# Patient Record
Sex: Female | Born: 2000 | Race: White | Hispanic: No | Marital: Single | Smoking: Never smoker
Health system: Southern US, Community
[De-identification: ages and names within clinical notes are randomized; demographics above are authoritative.]

## PROBLEM LIST (undated history)

## (undated) DIAGNOSIS — Q249 Congenital malformation of heart, unspecified: Secondary | ICD-10-CM

## (undated) HISTORY — DX: Congenital malformation of heart, unspecified: Q24.9

## (undated) HISTORY — PX: WISDOM TOOTH EXTRACTION: SHX21

## (undated) HISTORY — PX: CARDIAC VALVE SURGERY: SHX40

---
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
## 2020-02-13 MED FILL — CARTIA XT 120 MG CP24: 120 | 30 days supply | Qty: 30 | Fill #0

## 2020-03-22 MED FILL — JULEBER 0.15-30 MG-MCG TABS: 0.15-30 | 84 days supply | Qty: 84 | Fill #3

## 2020-03-22 MED FILL — CARTIA XT 120 MG CP24: 120 | 30 days supply | Qty: 30 | Fill #1

## 2020-04-15 MED FILL — CARTIA XT 120 MG CP24: 120 | 30 days supply | Qty: 30 | Fill #0

## 2020-05-31 MED FILL — CARTIA XT 180 MG CAPSULE SA: 180 | 90 days supply | Qty: 90 | Fill #0

## 2020-08-09 MED FILL — JULEBER 0.15-30 MG-MCG TABS: 0.15-30 | 28 days supply | Qty: 28 | Fill #0

## 2020-08-28 ENCOUNTER — Other Ambulatory Visit: Payer: BLUE CROSS/BLUE SHIELD

## 2020-08-28 DIAGNOSIS — Z20822 Contact with and (suspected) exposure to covid-19: Secondary | ICD-10-CM

## 2020-08-29 LAB — SARS-COV-2, NAA 2 DAY TAT

## 2020-08-29 LAB — NOVEL CORONAVIRUS, NAA: SARS-CoV-2, NAA: NOT DETECTED

## 2020-12-30 ENCOUNTER — Other Ambulatory Visit (HOSPITAL_COMMUNITY): Payer: Self-pay

## 2020-12-30 MED ORDER — DILTIAZEM HCL ER COATED BEADS 180 MG PO CP24
180.0000 mg | ORAL_CAPSULE | Freq: Every day | ORAL | 1 refills | Status: DC
  Filled 2020-12-30: qty 90, 90d supply, fill #0
  Filled 2021-03-26: qty 90, 90d supply, fill #1

## 2020-12-30 MED ORDER — DESOGESTREL-ETHINYL ESTRADIOL 0.15-30 MG-MCG PO TABS
ORAL_TABLET | Freq: Every day | ORAL | 2 refills | Status: DC
  Filled 2020-12-30: qty 84, 84d supply, fill #0
  Filled 2021-03-25: qty 28, 28d supply, fill #1
  Filled 2021-03-25: qty 84, 84d supply, fill #1
  Filled 2021-03-26: qty 84, 84d supply, fill #2
  Filled 2021-04-07: qty 28, 28d supply, fill #3
  Filled 2021-07-30: qty 28, 28d supply, fill #4

## 2020-12-31 ENCOUNTER — Other Ambulatory Visit (HOSPITAL_COMMUNITY): Payer: Self-pay

## 2021-02-28 ENCOUNTER — Other Ambulatory Visit: Payer: Self-pay | Admitting: Internal Medicine

## 2021-02-28 DIAGNOSIS — R221 Localized swelling, mass and lump, neck: Secondary | ICD-10-CM

## 2021-03-07 ENCOUNTER — Other Ambulatory Visit (HOSPITAL_COMMUNITY): Payer: Self-pay

## 2021-03-25 ENCOUNTER — Other Ambulatory Visit (HOSPITAL_COMMUNITY): Payer: Self-pay

## 2021-03-25 ENCOUNTER — Ambulatory Visit
Admission: RE | Admit: 2021-03-25 | Discharge: 2021-03-25 | Disposition: A | Discharge status: Home / Self Care | Payer: Self-pay | Source: Ambulatory Visit | Attending: Internal Medicine | Admitting: Internal Medicine

## 2021-03-25 DIAGNOSIS — R221 Localized swelling, mass and lump, neck: Secondary | ICD-10-CM

## 2021-03-26 ENCOUNTER — Other Ambulatory Visit (HOSPITAL_COMMUNITY): Payer: Self-pay

## 2021-03-28 ENCOUNTER — Other Ambulatory Visit (HOSPITAL_COMMUNITY): Payer: Self-pay

## 2021-04-07 ENCOUNTER — Other Ambulatory Visit (HOSPITAL_COMMUNITY): Payer: Self-pay

## 2021-04-07 MED ORDER — DILTIAZEM HCL ER COATED BEADS 180 MG PO CP24
180.0000 mg | ORAL_CAPSULE | Freq: Every day | ORAL | 1 refills | Status: DC
  Filled 2021-04-07 – 2021-04-08 (×2): qty 90, 90d supply, fill #0
  Filled 2022-01-08: qty 90, 90d supply, fill #1
  Filled 2023-01-15: qty 5, 5d supply, fill #2

## 2021-04-08 ENCOUNTER — Other Ambulatory Visit (HOSPITAL_COMMUNITY): Payer: Self-pay

## 2021-04-09 ENCOUNTER — Other Ambulatory Visit (HOSPITAL_COMMUNITY): Payer: Self-pay

## 2021-07-30 ENCOUNTER — Other Ambulatory Visit (HOSPITAL_COMMUNITY): Payer: Self-pay

## 2021-08-29 ENCOUNTER — Other Ambulatory Visit (HOSPITAL_COMMUNITY): Payer: Self-pay

## 2021-08-29 MED ORDER — DESOGESTREL-ETHINYL ESTRADIOL 0.15-30 MG-MCG PO TABS
1.0000 | ORAL_TABLET | Freq: Every day | ORAL | 3 refills | Status: DC
  Filled 2021-08-29: qty 84, 84d supply, fill #0
  Filled 2021-11-24: qty 84, 84d supply, fill #1
  Filled 2022-02-23: qty 84, 84d supply, fill #2

## 2021-10-06 ENCOUNTER — Other Ambulatory Visit (HOSPITAL_COMMUNITY): Payer: Self-pay

## 2021-11-24 ENCOUNTER — Other Ambulatory Visit (HOSPITAL_COMMUNITY): Payer: Self-pay

## 2022-01-08 ENCOUNTER — Other Ambulatory Visit (HOSPITAL_COMMUNITY): Payer: Self-pay

## 2022-02-23 ENCOUNTER — Other Ambulatory Visit (HOSPITAL_COMMUNITY): Payer: Self-pay

## 2022-04-11 ENCOUNTER — Other Ambulatory Visit (HOSPITAL_COMMUNITY): Payer: Self-pay

## 2022-05-13 ENCOUNTER — Other Ambulatory Visit (HOSPITAL_COMMUNITY): Payer: Self-pay

## 2022-05-19 ENCOUNTER — Other Ambulatory Visit: Payer: Self-pay | Admitting: Internal Medicine

## 2022-05-19 DIAGNOSIS — N926 Irregular menstruation, unspecified: Secondary | ICD-10-CM

## 2022-05-19 DIAGNOSIS — R1032 Left lower quadrant pain: Secondary | ICD-10-CM

## 2022-05-25 ENCOUNTER — Other Ambulatory Visit: Payer: Self-pay | Admitting: Internal Medicine

## 2022-05-25 ENCOUNTER — Ambulatory Visit
Admission: RE | Admit: 2022-05-25 | Discharge: 2022-05-25 | Disposition: A | Discharge status: Home / Self Care | Payer: Managed Care, Other (non HMO) | Source: Ambulatory Visit | Attending: Internal Medicine

## 2022-05-25 DIAGNOSIS — N926 Irregular menstruation, unspecified: Secondary | ICD-10-CM

## 2022-05-25 DIAGNOSIS — R1032 Left lower quadrant pain: Secondary | ICD-10-CM

## 2022-07-30 ENCOUNTER — Encounter: Payer: Self-pay | Admitting: Gastroenterology

## 2022-09-03 ENCOUNTER — Ambulatory Visit (INDEPENDENT_AMBULATORY_CARE_PROVIDER_SITE_OTHER): Payer: Managed Care, Other (non HMO) | Admitting: Gastroenterology

## 2022-09-03 ENCOUNTER — Encounter: Payer: Self-pay | Admitting: Gastroenterology

## 2022-09-03 VITALS — BP 126/82 | HR 110 | Ht 62.0 in | Wt 179.0 lb

## 2022-09-03 DIAGNOSIS — E611 Iron deficiency: Secondary | ICD-10-CM | POA: Diagnosis not present

## 2022-09-03 DIAGNOSIS — K625 Hemorrhage of anus and rectum: Secondary | ICD-10-CM

## 2022-09-03 DIAGNOSIS — K581 Irritable bowel syndrome with constipation: Secondary | ICD-10-CM

## 2022-09-03 NOTE — Patient Instructions (Addendum)
_______________________________________________________  If you are age 22 or older, your body mass index should be between 23-30. Your Body mass index is 32.74 kg/m. If this is out of the aforementioned range listed, please consider follow up with your Primary Care Provider.  If you are age 59 or younger, your body mass index should be between 19-25. Your Body mass index is 32.74 kg/m. If this is out of the aformentioned range listed, please consider follow up with your Primary Care Provider.   Your provider has requested that you go to the basement level for lab work before leaving today. Press "B" on the elevator. The lab is located at the first door on the left as you exit the elevator.   Increase fiber gummies to twice daily.  Senna two tablets as needed when you have not had a bowel movement in two days.  The [facility] providers would like to encourage you to use [facility] to communicate with providers for non-urgent requests or questions.  Due to long hold times on the telephone, sending your provider a message by [facility] may be a faster and more efficient way to get a response.  Please allow 48 business hours for a response.  Please remember that this is for non-urgent requests.   It was a pleasure to see you today!  Thank you for trusting me with your gastrointestinal care!    Scott E.Candis Schatz, MD

## 2022-09-03 NOTE — Progress Notes (Signed)
HPI : Loretta Lowe is a very pleasant 22 year old female with a history of congenital heart disease referred to [place] by Dr. Cristie Hem for further evaluation of chronic abdominal pain and constipation.  She says these problems started over a year ago.  Her constipation manifests as infrequent bowel movements, typically only going every 3 days or so, but sometimes going as long as 5 days.  Her stools are typically soft and formed, but sometimes they can be small, hard, and pellet-like.  She denies frequent problems with straining or difficulty evacuating stool.  She has seen blood intermittently over the past year.  Blood seen only on the toilet paper and is noticed more when she has hard stools.  She denies problems with pain with the passage of stool.  No symptoms of perianal swelling/lumps, itching or burning.  She has chronic abdominal pain which is localized to the left lower quadrant.  She describes it as a dull achy pain, similar to a pulled muscle.  Sometimes it is throbbing in character.  Her pain does not seem to vary much with her diet or with her bowel habits.  It does not necessarily get worse if she is gone long periods without a bowel movement.  It is not improved with bowel movements usually.  The pain is not severe, typically 1-2/10 in severity.  It is not worsened with any particular physical activities or positions.   She reports frequently feeling bloated and gassy.  No problems with nausea or vomiting.  No problems with diarrhea.  She has tried taking magnesium tablets which did not make any difference in her symptoms.  She started taking gummy fibers recently, and this does seem to help a little bit.  She is also taking MiraLAX in the past, but not consistently.  She did not think it helped very much either when she took it.  She states that she was recently told that she had low iron levels.  She was not anemic.  She was advised not to take oral iron supplements because it would worsen  her constipation.  She is to be vegetarian, but she is starting to add meat back into her diet to increase her dietary iron intake. She drinks plenty of water, typically 40 ounces per day.  She has no family history of celiac disease, inflammatory bowel disease or malignancy of the GI tract.  Her mother does have constipation, and likely IBS.  Patient has a history of congenital heart disease (double aortic arch).  She underwent surgery date 6 months.  She had no problems until she was found to have an arrhythmia (frequent PVCs) about 3 years ago.  She has been followed by cardiology and takes diltiazem.   Past Medical History:  Diagnosis Date   Cardiac arrhythmia due to congenital heart disease    Past Surgical History:  Procedure Laterality Date   CARDIAC VALVE SURGERY     Family History  Problem Relation Age of Onset   Hypertension Father    Breast cancer Maternal Grandmother    Diabetes Maternal Grandfather    Lung cancer Maternal Grandfather    COPD Paternal Grandfather    Colon cancer Neg Hx    Stomach cancer Neg Hx    Esophageal cancer Neg Hx    Social History   Tobacco Use   Smoking status: Never   Smokeless tobacco: Never  Substance Use Topics   Alcohol use: No   Drug use: No   Current Outpatient Medications  Medication Sig Dispense Refill   desogestrel-ethinyl estradiol (JULEBER) 0.15-30 MG-MCG tablet Take 1 tablet by mouth daily. 84 tablet 3   diltiazem (CARDIZEM CD) 180 MG 24 hr capsule Take 1 capsule (180 mg total) by mouth daily. 90 capsule 1   NON FORMULARY Fiber gummie     ACANYA gel      minocycline (MINOCIN,DYNACIN) 100 MG capsule      TAZORAC 0.05 % cream      No current facility-administered medications for this visit.   No Known Allergies   Review of Systems: All systems reviewed and negative except where noted in HPI.    No results found.  Physical Exam: BP 126/82   Pulse (!) 110   Ht 5\' 2"  (1.575 m)   Wt 179 lb (81.2 kg)   BMI 32.74  kg/m  Constitutional: Pleasant,well-developed, Caucasian female in no acute distress. HEENT: Normocephalic and atraumatic. Conjunctivae are normal. No scleral icterus. Neck supple.  Cardiovascular: Normal rate, regular rhythm.  Frequent ectopy noted Pulmonary/chest: Effort normal and breath sounds normal. No wheezing, rales or rhonchi. Abdominal: Soft, nondistended, nontender.  Left lower quadrant pain not reproducible on exam.  Bowel sounds active throughout. There are no masses palpable. No hepatomegaly. Extremities: no edema Neurological: Alert and oriented to person place and time. Skin: Skin is warm and dry. No rashes noted. Psychiatric: Normal mood and affect. Behavior is normal.  CBC No results found for: "WBC", "RBC", "HGB", "HCT", "PLT", "MCV", "MCH", "MCHC", "RDW", "LYMPHSABS", "MONOABS", "EOSABS", "BASOSABS"  CMP  No results found for: "NA", "K", "CL", "CO2", "GLUCOSE", "BUN", "CREATININE", "CALCIUM", "PROT", "ALBUMIN", "AST", "ALT", "ALKPHOS", "BILITOT", "GFRNONAA", "GFRAA"   ASSESSMENT AND PLAN: 22 year old female with 1 year history of bothersome constipation and abdominal discomfort.  Although her symptoms do not fit exactly with IBS (no association between bowel movements and pain), I think this is the most likely diagnosis.  We discussed the proposed pathophysiology of IBS and gut brain axis disorders in general.  We discussed management of IBS, to include use of medications to improve bowel habits, as needed pain medicine, centrally acting neuromodulators, role of empiric dietary modifications to include a low FODMAP diet gluten-free diet, as well as the role of cognitive therapies.  We discussed the goals of IBS management, namely to minimize the impact of GI symptoms on quality of life. For now, I recommend she just continue with fiber supplementation and to take 2 tabs of senna at night as needed for when she has gone 2 or 3 days without a bowel movement. Her infrequent  painless hematochezia is almost certainly from hemorrhoidal bleeding when she has hard stools.  She does not have symptoms suggestive of proctitis for inflammatory bowel disease.  A mass lesion is extremely unlikely given her young age, lack of family history and association of blood with hard stools.  A colonoscopy was offered to definitively exclude a mass lesion especially with iron deficiency, but with the caveat that it would be very low yield.  She preferred to defer colonoscopy for now given low likelihood of any abnormalities.  Reconsider colonoscopy if bleeding becomes more frequent or profuse.   IBS-C - Fiber gummies - Senna PRN  Painless hematochezia - Almost certainly hemorrhoidal - Colonoscopy discussed, but deferred for now  Iron deficiency without anemia - Likely secondary to menstrual blood loss/low dietary intake  Macenzie Burford E. Candis Schatz, MD [facility]  CC:  Michael Boston, MD

## 2022-09-04 ENCOUNTER — Other Ambulatory Visit: Payer: Managed Care, Other (non HMO)

## 2022-09-04 DIAGNOSIS — K581 Irritable bowel syndrome with constipation: Secondary | ICD-10-CM

## 2022-09-05 LAB — IGA: Immunoglobulin A: 194 mg/dL (ref 47–310)

## 2022-09-05 LAB — TISSUE TRANSGLUTAMINASE, IGA: (tTG) Ab, IgA: <1 U/mL

## 2022-09-06 NOTE — Progress Notes (Signed)
Loretta Lowe,  Your test for celiac disease was unremarkable.  Please continue the fiber and senna as recommended and follow up as needed for ongoing assistance with your chronic constipation and abdominal pain.

## 2022-09-09 ENCOUNTER — Encounter: Payer: Self-pay | Admitting: Gastroenterology

## 2022-09-18 ENCOUNTER — Other Ambulatory Visit (HOSPITAL_COMMUNITY): Payer: Self-pay

## 2022-09-18 ENCOUNTER — Other Ambulatory Visit: Payer: Self-pay

## 2022-09-18 MED ORDER — DICYCLOMINE HCL 20 MG PO TABS
20.0000 mg | ORAL_TABLET | Freq: Four times a day (QID) | ORAL | 1 refills | Status: AC
  Filled 2022-09-18: qty 30, 8d supply, fill #0

## 2022-09-21 ENCOUNTER — Other Ambulatory Visit (HOSPITAL_COMMUNITY): Payer: Self-pay

## 2022-10-23 ENCOUNTER — Other Ambulatory Visit (HOSPITAL_COMMUNITY): Payer: Self-pay

## 2022-10-23 ENCOUNTER — Telehealth: Payer: Self-pay | Admitting: *Deleted

## 2022-10-23 ENCOUNTER — Encounter: Payer: Self-pay | Admitting: Gastroenterology

## 2022-10-23 ENCOUNTER — Ambulatory Visit (AMBULATORY_SURGERY_CENTER): Payer: Managed Care, Other (non HMO) | Admitting: *Deleted

## 2022-10-23 VITALS — Ht 64.0 in | Wt 174.0 lb

## 2022-10-23 DIAGNOSIS — Z1211 Encounter for screening for malignant neoplasm of colon: Secondary | ICD-10-CM

## 2022-10-23 MED ORDER — NA SULFATE-K SULFATE-MG SULF 17.5-3.13-1.6 GM/177ML PO SOLN
1.0000 | Freq: Once | ORAL | 0 refills | Status: AC
  Filled 2022-10-23: qty 354, 1d supply, fill #0

## 2022-10-23 NOTE — Progress Notes (Signed)
No egg or soy allergy known to patient  No issues known to pt with past sedation with any surgeries or procedures Patient denies ever being told they had issues or difficulty with intubation  No FH of Malignant Hyperthermia Pt is not on diet pills Pt is not on  home 02  Pt is not on blood thinners  Pt denies issues with constipation Instructed to use Miralax for issue 5 days prior to prep days Pt is not on dialysis Pt denies any upcoming cardiac testing Pt encouraged to use to use Singlecare or Goodrx to reduce cost  Patient's chart reviewed by Osvaldo Angst CNRA prior to previsit and patient appropriate for the [place].  Previsit completed and red dot placed by patient's name on their procedure day (on provider's schedule).  . Visit by phone Instructions reviewed with pt and pt states understanding. Instructed to review again prior to procedure. Pt states they will. Instructions sent by mail with coupon and by my chart Pt gave permission to call her Mom to clarify which [facility] to go to for prep so she can use coupon

## 2022-10-23 NOTE — Telephone Encounter (Signed)
Call to Mom of pt with her permission to clarify which [facility] she want to pick up  prep. Unable to reach. LM with call back #

## 2022-10-28 ENCOUNTER — Other Ambulatory Visit (HOSPITAL_COMMUNITY): Payer: Self-pay

## 2022-10-30 ENCOUNTER — Encounter: Payer: Managed Care, Other (non HMO) | Admitting: Gastroenterology

## 2022-11-11 ENCOUNTER — Ambulatory Visit: Payer: Managed Care, Other (non HMO) | Admitting: Gastroenterology

## 2022-11-11 ENCOUNTER — Encounter: Payer: Self-pay | Admitting: Gastroenterology

## 2022-11-11 VITALS — BP 109/61 | HR 47 | Temp 98.1°F | Resp 15 | Ht 62.0 in | Wt 174.0 lb

## 2022-11-11 DIAGNOSIS — K921 Melena: Secondary | ICD-10-CM | POA: Diagnosis not present

## 2022-11-11 DIAGNOSIS — Z1211 Encounter for screening for malignant neoplasm of colon: Secondary | ICD-10-CM

## 2022-11-11 MED ORDER — SODIUM CHLORIDE 0.9 % IV SOLN: 500.0000 mL | Freq: Once | INTRAVENOUS | Status: DC

## 2022-11-11 NOTE — Progress Notes (Signed)
Report to PACU, RN, vss, BBS= Clear.  

## 2022-11-11 NOTE — Op Note (Signed)
[facility] Patient Name: Loretta Lowe Procedure Date: 11/11/2022 8:46 AM MRN: [id number] Endoscopist: Nicki Reaper E. Candis Schatz , MD, [id number] Age: 22 Referring MD:  Date of Birth: 01-14-2001 Gender: Female Account #: [id number] Procedure:                Colonoscopy Indications:              Abdominal pain in the left lower quadrant,                            Hematochezia Medicines:                Monitored Anesthesia Care Procedure:                Pre-Anesthesia Assessment:                           - Prior to the procedure, a History and Physical                            was performed, and patient medications and                            allergies were reviewed. The patient's tolerance of                            previous anesthesia was also reviewed. The risks                            and benefits of the procedure and the sedation                            options and risks were discussed with the patient.                            All questions were answered, and informed consent                            was obtained. Prior Anticoagulants: The patient has                            taken no anticoagulant or antiplatelet agents. ASA                            Grade Assessment: II - A patient with mild systemic                            disease. After reviewing the risks and benefits,                            the patient was deemed in satisfactory condition to                            undergo the procedure.  After obtaining informed consent, the colonoscope                            was passed under direct vision. Throughout the                            procedure, the patient's blood pressure, pulse, and                            oxygen saturations were monitored continuously. The                            Olympus CF-HQ190L ([id number]) Colonoscope was                            introduced through the anus and advanced to the the                             terminal ileum, with identification of the                            appendiceal orifice and IC valve. The colonoscopy                            was performed without difficulty. The patient                            tolerated the procedure well. The quality of the                            bowel preparation was good. The terminal ileum,                            ileocecal valve, appendiceal orifice, and rectum                            were photographed. The bowel preparation used was                            SUPREP via split dose instruction. Scope In: 9:06:21 AM Scope Out: 9:18:04 AM Scope Withdrawal Time: 0 hours 6 minutes 40 seconds  Total Procedure Duration: 0 hours 11 minutes 43 seconds  Findings:                 The perianal and digital rectal examinations were                            normal. Pertinent negatives include normal                            sphincter tone and no palpable rectal lesions.                           The colon (entire examined portion) appeared normal.  The terminal ileum appeared normal.                           The retroflexed view of the distal rectum and anal                            verge was normal and showed no anal or rectal                            abnormalities. Complications:            No immediate complications. Estimated Blood Loss:     Estimated blood loss: none. Impression:               - The entire examined colon is normal.                           - The examined portion of the ileum was normal.                           - The distal rectum and anal verge are normal on                            retroflexion view.                           - No specimens collected.                           - Although no prominent hemorrhoids seen on today's                            exam, the patient's recurrent hematochezia can be                            attributed to a hemorrhoidal  source, given the                            absence of any other bleeding sources                           - The GI Genius (intelligent endoscopy module),                            computer-aided polyp detection system powered by AI                            was utilized to detect colorectal polyps through                            enhanced visualization during colonoscopy. Recommendation:           - Patient has a contact number available for                            emergencies. The signs and  symptoms of potential                            delayed complications were discussed with the                            patient. Return to normal activities tomorrow.                            Written discharge instructions were provided to the                            patient.                           - Resume previous diet.                           - Continue present medications.                           - Repeat colonoscopy at age 56 for screening                            purposes.                           - Recommend daily fiber supplementation for                            constipation and to reduce hemorrhoidal symptoms Jahden Schara E. Candis Schatz, MD 11/11/2022 9:24:38 AM This report has been signed electronically.

## 2022-11-11 NOTE — Progress Notes (Signed)
Pt's states no medical or surgical changes since previsit or office visit. 

## 2022-11-11 NOTE — Progress Notes (Signed)
[facility] History and Physical   Primary Care Physician:  Michael Boston, MD   Reason for Procedure:   Hematochezia  Plan:    Colonoscopy     HPI: Loretta Lowe is a 22 y.o. female undergoing colonoscopy to evaluate recurrent hematochezia.  She also has problems with chronic constipation and abdominal pain.  She has no family history of colon cancer and no chronic GI symptoms.    Past Medical History:  Diagnosis Date   Cardiac arrhythmia due to congenital heart disease     Past Surgical History:  Procedure Laterality Date   CARDIAC VALVE SURGERY     WISDOM TOOTH EXTRACTION      Prior to Admission medications   Medication Sig Start Date End Date Taking? Authorizing Provider  desogestrel-ethinyl estradiol (JULEBER) 0.15-30 MG-MCG tablet Take 1 tablet by mouth daily. 08/29/21  Yes   diltiazem (CARDIZEM CD) 180 MG 24 hr capsule Take 1 capsule (180 mg total) by mouth daily. 04/07/21  Yes   dicyclomine (BENTYL) 20 MG tablet Take 1 tablet (20 mg total) by mouth every 6 (six) hours. Patient not taking: Reported on 10/23/2022 09/18/22   Daryel November, MD  NON FORMULARY Fiber gummie    [provider]    Current Outpatient Medications  Medication Sig Dispense Refill   desogestrel-ethinyl estradiol (JULEBER) 0.15-30 MG-MCG tablet Take 1 tablet by mouth daily. 84 tablet 3   diltiazem (CARDIZEM CD) 180 MG 24 hr capsule Take 1 capsule (180 mg total) by mouth daily. 90 capsule 1   dicyclomine (BENTYL) 20 MG tablet Take 1 tablet (20 mg total) by mouth every 6 (six) hours. (Patient not taking: Reported on 10/23/2022) 30 tablet 1   NON FORMULARY Fiber gummie     Current Facility-Administered Medications  Medication Dose Route Frequency Provider Last Rate Last Admin   0.9 %  sodium chloride infusion  500 mL Intravenous Once Daryel November, MD        Allergies as of 11/11/2022   (No Known Allergies)    Family History  Problem Relation Age of Onset    Hypertension Father    Breast cancer Maternal Grandmother    Diabetes Maternal Grandfather    Lung cancer Maternal Grandfather    COPD Paternal Grandfather    Colon cancer Neg Hx    Stomach cancer Neg Hx    Esophageal cancer Neg Hx    Colon polyps Neg Hx    Rectal cancer Neg Hx     Social History   Socioeconomic History   Marital status: Single    Spouse name: Not on file   Number of children: 0   Years of education: Not on file   Highest education level: Not on file  Occupational History   Occupation: student  Tobacco Use   Smoking status: Never   Smokeless tobacco: Never  Vaping Use   Vaping Use: Never used  Substance and Sexual Activity   Alcohol use: No    Comment: 1-2 drinks a week   Drug use: No   Sexual activity: Not Currently    Birth control/protection: Pill  Other Topics Concern   Not on file  Social History Narrative   Not on file   Social Determinants of Health   Financial Resource Strain: Not on file  Food Insecurity: Not on file  Transportation Needs: Not on file  Physical Activity: Not on file  Stress: Not on file  Social Connections: Not on file  Intimate Partner Violence:  Not on file    Review of Systems:  All other review of systems negative except as mentioned in the HPI.  Physical Exam: Vital signs BP (!) 150/84   Pulse 95   Temp 98.1 F (36.7 C)   Resp 11   Ht 5\' 2"  (1.575 m)   Wt 174 lb (78.9 kg)   LMP 11/07/2022 (Exact Date)   SpO2 99%   BMI 31.83 kg/m   General:   Alert,  Well-developed, well-nourished, pleasant and cooperative in NAD Airway:  Mallampati 2 Lungs:  Clear throughout to auscultation.   Heart:  Regular rate and rhythm; no murmurs, clicks, rubs,  or gallops. Abdomen:  Soft, nontender and nondistended. Normal bowel sounds.   Neuro/Psych:  Normal mood and affect. A and O x 3   Dimas Scheck E. Candis Schatz, MD [facility]

## 2022-11-11 NOTE — Patient Instructions (Signed)
Please read handouts provided. Continue present medications. Repeat colonoscopy at age 22 for screening. Recommend daily fiber supplementation for constipation and to reduce hemorrhoidal symptoms.   YOU HAD AN ENDOSCOPIC PROCEDURE TODAY AT [facility]:   Refer to the procedure report that was given to you for any specific questions about what was found during the examination.  If the procedure report does not answer your questions, please call your gastroenterologist to clarify.  If you requested that your care partner not be given the details of your procedure findings, then the procedure report has been included in a sealed envelope for you to review at your convenience later.  YOU SHOULD EXPECT: Some feelings of bloating in the abdomen. Passage of more gas than usual.  Walking can help get rid of the air that was put into your GI tract during the procedure and reduce the bloating. If you had a lower endoscopy (such as a colonoscopy or flexible sigmoidoscopy) you may notice spotting of blood in your stool or on the toilet paper. If you underwent a bowel prep for your procedure, you may not have a normal bowel movement for a few days.  Please Note:  You might notice some irritation and congestion in your nose or some drainage.  This is from the oxygen used during your procedure.  There is no need for concern and it should clear up in a day or so.  SYMPTOMS TO REPORT IMMEDIATELY:  Following lower endoscopy (colonoscopy or flexible sigmoidoscopy):  Excessive amounts of blood in the stool  Significant tenderness or worsening of abdominal pains  Swelling of the abdomen that is new, acute  Fever of 100F or higher   For urgent or emergent issues, a gastroenterologist can be reached at any hour by calling [phone]. Do not use MyChart messaging for urgent concerns.    DIET:  We do recommend a small meal at first, but then you may proceed to your regular diet.  Drink plenty  of fluids but you should avoid alcoholic beverages for 24 hours.  ACTIVITY:  You should plan to take it easy for the rest of today and you should NOT DRIVE or use heavy machinery until tomorrow (because of the sedation medicines used during the test).    FOLLOW UP: Our staff will call the number listed on your records the next business day following your procedure.  We will call around 7:15- 8:00 am to check on you and address any questions or concerns that you may have regarding the information given to you following your procedure. If we do not reach you, we will leave a message.     If any biopsies were taken you will be contacted by phone or by letter within the next 1-3 weeks.  Please call us at [phone] if you have not heard about the biopsies in 3 weeks.    SIGNATURES/CONFIDENTIALITY: You and/or your care partner have signed paperwork which will be entered into your electronic medical record.  These signatures attest to the fact that that the information above on your After Visit Summary has been reviewed and is understood.  Full responsibility of the confidentiality of this discharge information lies with you and/or your care-partner.

## 2022-11-12 ENCOUNTER — Telehealth: Payer: Self-pay | Admitting: *Deleted

## 2022-11-12 NOTE — Telephone Encounter (Signed)
  Follow up Call-     11/11/2022    8:27 AM  Call back number  Post procedure Call Back phone  # [phone]  Permission to leave phone message Yes     Patient questions:  Do you have a fever, pain , or abdominal swelling? No. Pain Score  0 *  Have you tolerated food without any problems? Yes.    Have you been able to return to your normal activities? Yes.    Do you have any questions about your discharge instructions: Diet   No. Medications  No. Follow up visit  No.  Do you have questions or concerns about your Care? No.  Actions: * If pain score is 4 or above: No action needed, pain <4.

## 2022-11-25 ENCOUNTER — Other Ambulatory Visit (HOSPITAL_COMMUNITY): Payer: Self-pay

## 2022-12-11 IMAGING — US US SOFT TISSUE HEAD/NECK
1 series · 13 of 13 positions shown · non-contrast
Comparison: None.

CLINICAL DATA: Right submandibular fullness

EXAM:
ULTRASOUND OF HEAD/NECK SOFT TISSUES
TECHNIQUE: Ultrasound examination of the head and neck soft tissues was
performed in the area of clinical concern.

[Series 1: us soft tissue head/neck · 0.06mm/px · 13 of 13 slices shown]
[im 1/13]
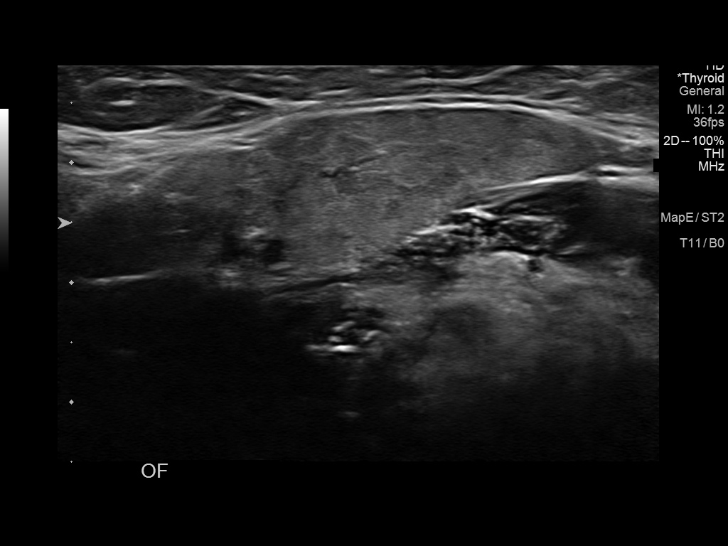
[im 2/13]
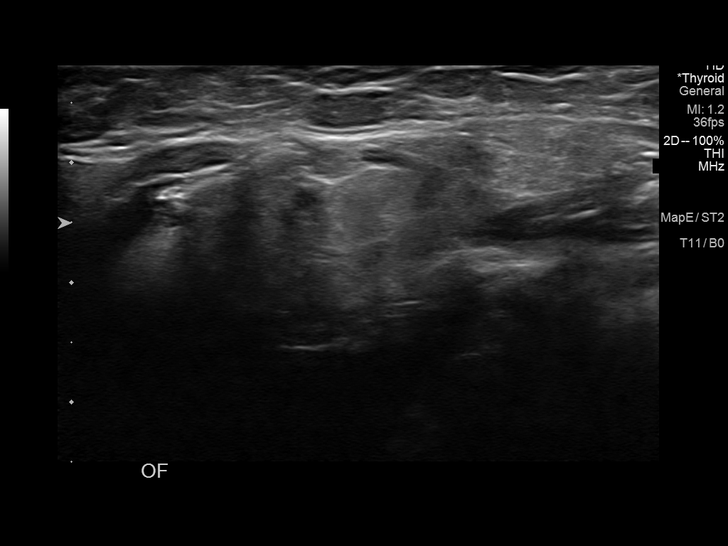
[im 3/13]
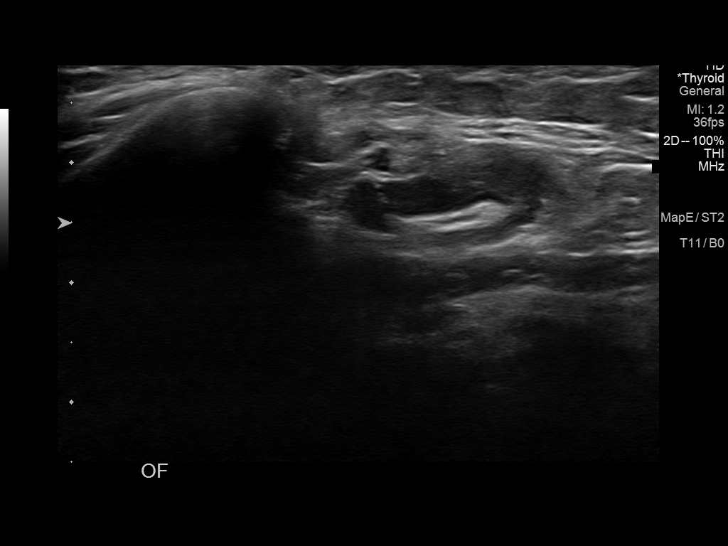
[im 4/13]
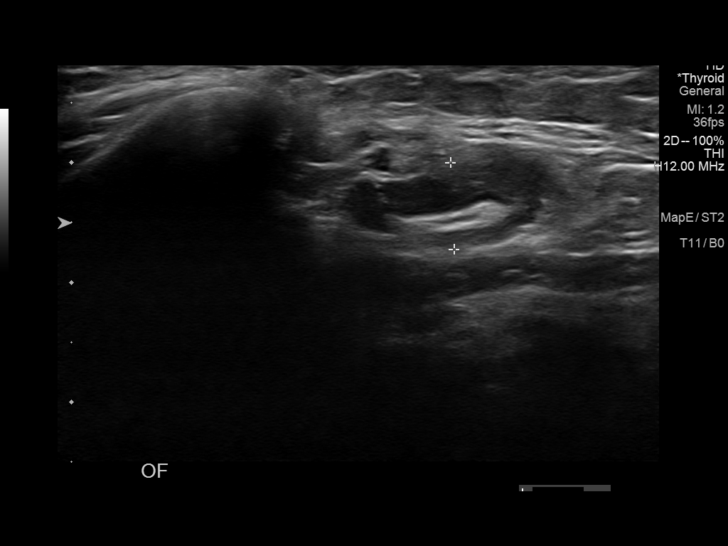
[im 5/13]
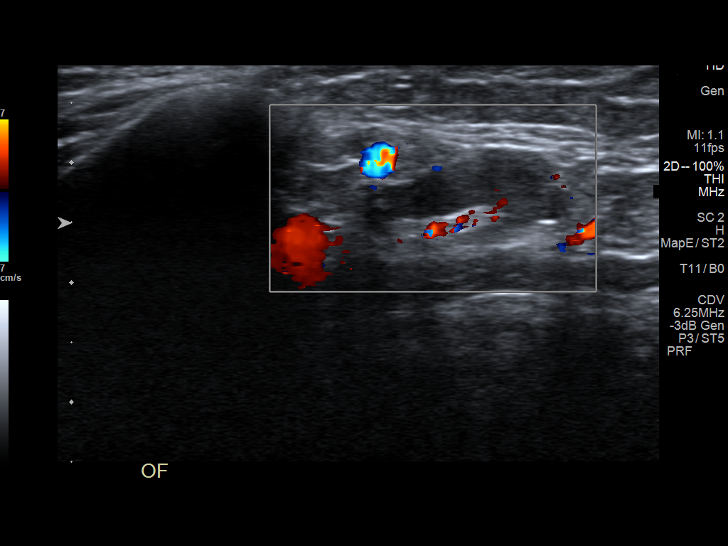
[im 6/13]
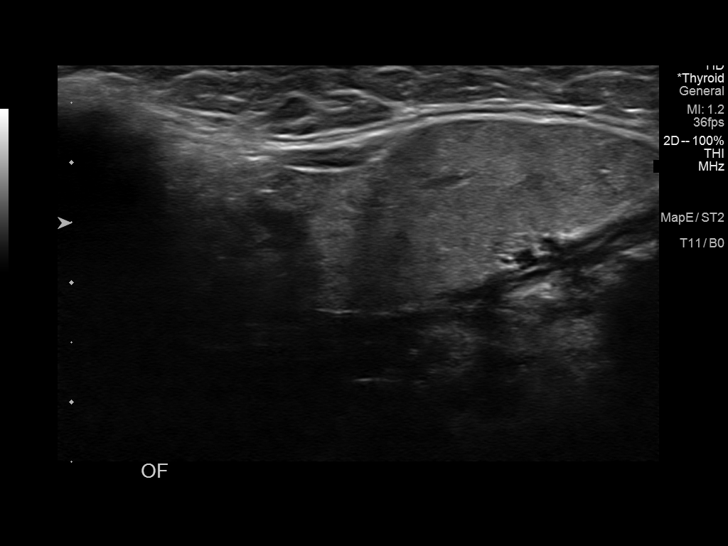
[im 7/13]
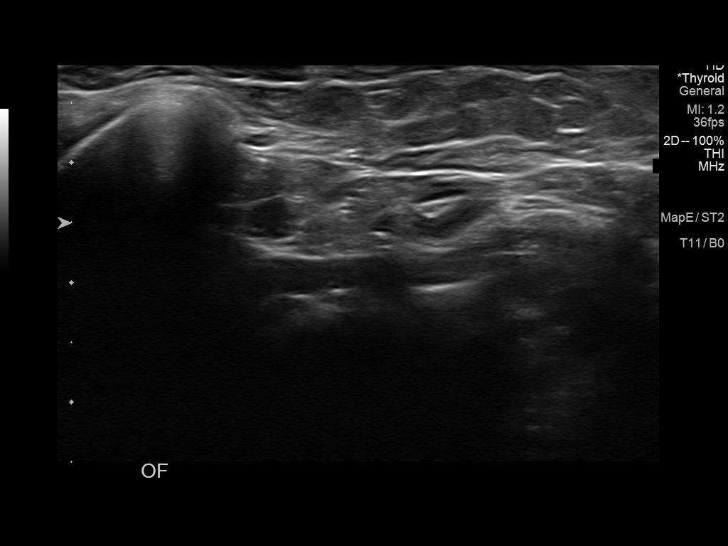
[im 8/13]
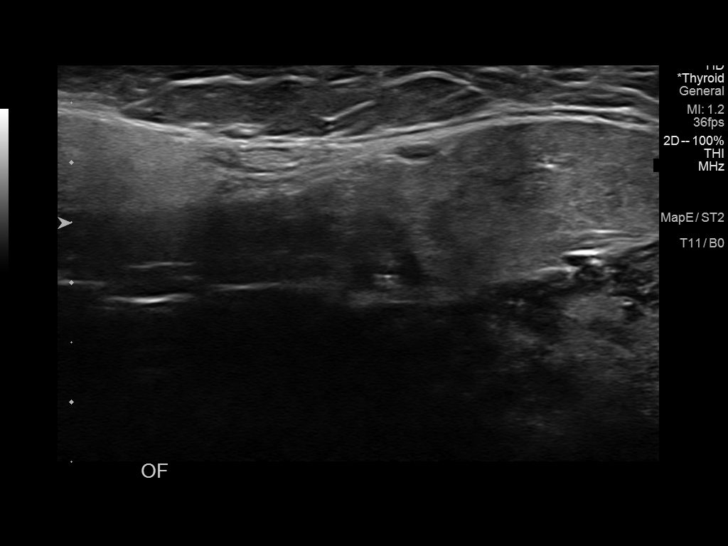
[im 9/13]
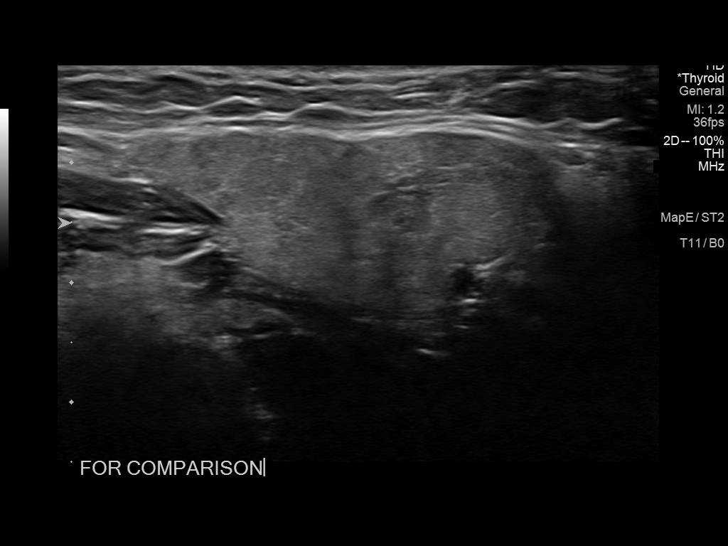
[im 10/13]
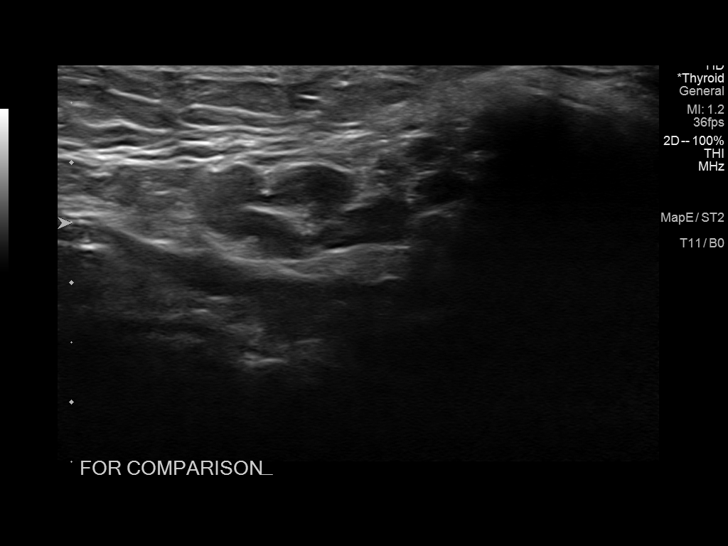
[im 11/13]
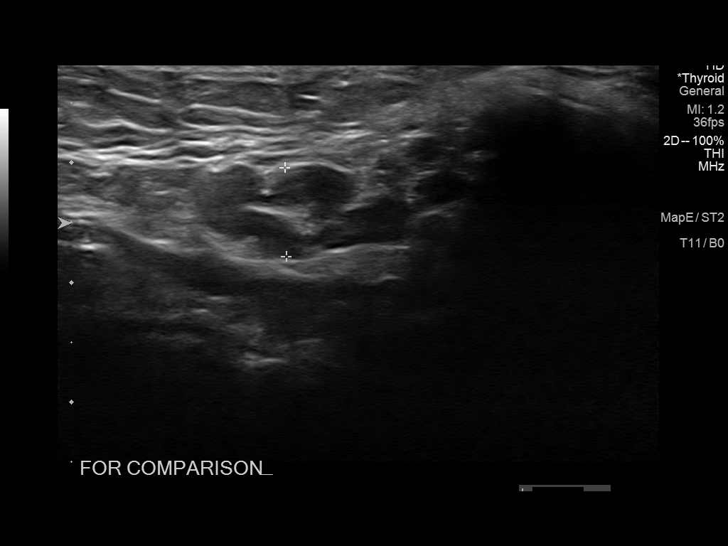
[im 12/13]
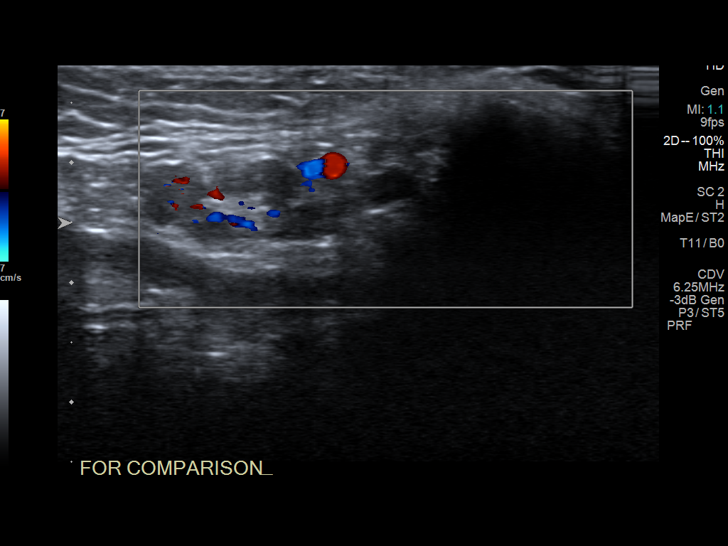
[im 13/13]
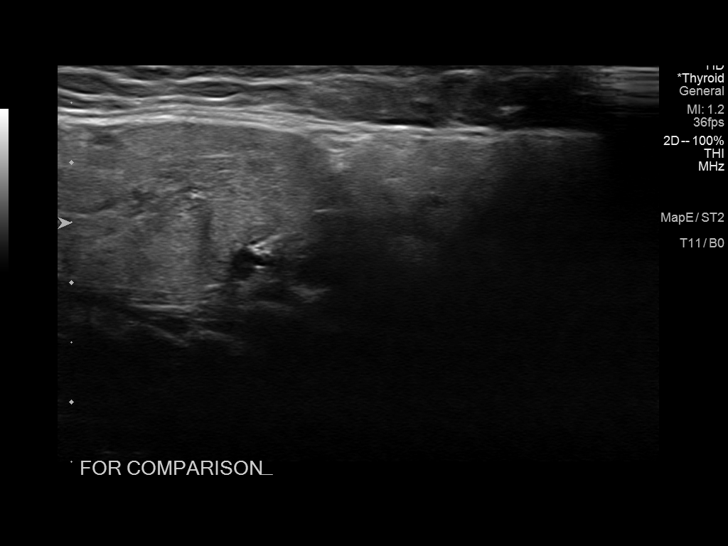

[13 of 13 positions shown; findings below may reference images not displayed]

FINDINGS: Ultrasound performed of the right submandibular area of concern and
compared to the left submandibular gland. No submandibular region
soft tissue mass, cyst, fluid collection, hematoma, or abscess.
Submandibular glands appear symmetric. Right submandibular mildly
prominent benign-appearing lymph node noted with a short axis
measurement of 7 mm.
IMPRESSION: No significant submandibular abnormality by soft tissue ultrasound.
Benign lymph nodes noted.

## 2023-01-15 ENCOUNTER — Other Ambulatory Visit (HOSPITAL_COMMUNITY): Payer: Self-pay

## 2023-01-15 ENCOUNTER — Other Ambulatory Visit: Payer: Self-pay

## 2023-01-16 ENCOUNTER — Other Ambulatory Visit (HOSPITAL_COMMUNITY): Payer: Self-pay

## 2023-01-16 MED ORDER — DILTIAZEM HCL ER COATED BEADS 180 MG PO CP24
180.0000 mg | ORAL_CAPSULE | Freq: Every day | ORAL | 1 refills | Status: AC
  Filled 2023-01-16: qty 90, 90d supply, fill #0

## 2023-01-19 ENCOUNTER — Other Ambulatory Visit (HOSPITAL_COMMUNITY): Payer: Self-pay

## 2023-01-20 ENCOUNTER — Other Ambulatory Visit (HOSPITAL_COMMUNITY): Payer: Self-pay

## 2023-04-15 ENCOUNTER — Other Ambulatory Visit (HOSPITAL_COMMUNITY): Payer: Self-pay

## 2023-04-16 ENCOUNTER — Other Ambulatory Visit (HOSPITAL_COMMUNITY): Payer: Self-pay

## 2023-04-16 MED ORDER — JULEBER 0.15-30 MG-MCG PO TABS
1.0000 | ORAL_TABLET | Freq: Every day | ORAL | 1 refills | Status: AC
  Filled 2023-04-16: qty 84, 84d supply, fill #0

## 2023-04-16 MED ORDER — DILTIAZEM HCL ER COATED BEADS 180 MG PO CP24
180.0000 mg | ORAL_CAPSULE | Freq: Every day | ORAL | 0 refills | Status: AC
  Filled 2023-04-16: qty 90, 90d supply, fill #0

## 2023-04-19 ENCOUNTER — Other Ambulatory Visit (HOSPITAL_COMMUNITY): Payer: Self-pay

## 2023-04-19 MED ORDER — DILTIAZEM HCL ER COATED BEADS 180 MG PO CP24
180.0000 mg | ORAL_CAPSULE | Freq: Every day | ORAL | 1 refills | Status: AC
  Filled 2023-04-19: qty 90, 90d supply, fill #0

## 2023-06-09 ENCOUNTER — Other Ambulatory Visit (HOSPITAL_COMMUNITY): Payer: Self-pay
